# Patient Record
Sex: Male | Born: 1995 | Hispanic: Yes | Marital: Married | Smoking: Current every day smoker
Health system: Southern US, Community
[De-identification: ages and names within clinical notes are randomized; demographics above are authoritative.]

## PROBLEM LIST (undated history)

## (undated) DIAGNOSIS — J45909 Unspecified asthma, uncomplicated: Secondary | ICD-10-CM

## (undated) HISTORY — PX: WISDOM TOOTH EXTRACTION: SHX21

---

## 2018-10-05 ENCOUNTER — Emergency Department (HOSPITAL_COMMUNITY): Payer: Self-pay

## 2018-10-05 ENCOUNTER — Other Ambulatory Visit: Payer: Self-pay

## 2018-10-05 ENCOUNTER — Encounter (HOSPITAL_COMMUNITY): Payer: Self-pay

## 2018-10-05 ENCOUNTER — Emergency Department (HOSPITAL_COMMUNITY)
Admission: EM | Admit: 2018-10-05 | Discharge: 2018-10-06 | Disposition: A | Discharge status: Home / Self Care | Payer: Self-pay | Attending: Emergency Medicine | Admitting: Emergency Medicine

## 2018-10-05 DIAGNOSIS — W1789XA Other fall from one level to another, initial encounter: Secondary | ICD-10-CM | POA: Insufficient documentation

## 2018-10-05 DIAGNOSIS — R55 Syncope and collapse: Secondary | ICD-10-CM | POA: Insufficient documentation

## 2018-10-05 DIAGNOSIS — M25512 Pain in left shoulder: Secondary | ICD-10-CM | POA: Insufficient documentation

## 2018-10-05 DIAGNOSIS — Y999 Unspecified external cause status: Secondary | ICD-10-CM | POA: Insufficient documentation

## 2018-10-05 DIAGNOSIS — S42124A Nondisplaced fracture of acromial process, right shoulder, initial encounter for closed fracture: Secondary | ICD-10-CM | POA: Insufficient documentation

## 2018-10-05 DIAGNOSIS — Y9389 Activity, other specified: Secondary | ICD-10-CM | POA: Insufficient documentation

## 2018-10-05 DIAGNOSIS — F17210 Nicotine dependence, cigarettes, uncomplicated: Secondary | ICD-10-CM | POA: Insufficient documentation

## 2018-10-05 DIAGNOSIS — S0101XA Laceration without foreign body of scalp, initial encounter: Secondary | ICD-10-CM | POA: Insufficient documentation

## 2018-10-05 DIAGNOSIS — W19XXXA Unspecified fall, initial encounter: Secondary | ICD-10-CM

## 2018-10-05 DIAGNOSIS — J45909 Unspecified asthma, uncomplicated: Secondary | ICD-10-CM | POA: Insufficient documentation

## 2018-10-05 DIAGNOSIS — R0789 Other chest pain: Secondary | ICD-10-CM | POA: Insufficient documentation

## 2018-10-05 DIAGNOSIS — Y92810 Car as the place of occurrence of the external cause: Secondary | ICD-10-CM | POA: Insufficient documentation

## 2018-10-05 DIAGNOSIS — M549 Dorsalgia, unspecified: Secondary | ICD-10-CM | POA: Insufficient documentation

## 2018-10-05 DIAGNOSIS — S161XXA Strain of muscle, fascia and tendon at neck level, initial encounter: Secondary | ICD-10-CM | POA: Insufficient documentation

## 2018-10-05 DIAGNOSIS — S0990XA Unspecified injury of head, initial encounter: Secondary | ICD-10-CM

## 2018-10-05 HISTORY — DX: Unspecified asthma, uncomplicated: J45.909

## 2018-10-05 LAB — COMPREHENSIVE METABOLIC PANEL
ALT: 16 U/L (ref 0–44)
AST: 20 U/L (ref 15–41)
Albumin: 4.3 g/dL (ref 3.5–5.0)
Alkaline Phosphatase: 58 U/L (ref 38–126)
Anion gap: 12 (ref 5–15)
BUN: 15 mg/dL (ref 6–20)
CO2: 22 mmol/L (ref 22–32)
Calcium: 9 mg/dL (ref 8.9–10.3)
Chloride: 108 mmol/L (ref 98–111)
Creatinine, Ser: 1.16 mg/dL (ref 0.61–1.24)
GFR calc Af Amer: >60 mL/min (ref >60)
GFR calc non Af Amer: >60 mL/min (ref >60)
Glucose, Bld: 103 mg/dL — ABNORMAL HIGH (ref 70–99)
Potassium: 3.6 mmol/L (ref 3.5–5.1)
Sodium: 142 mmol/L (ref 135–145)
Total Bilirubin: 0.5 mg/dL (ref 0.3–1.2)
Total Protein: 7.3 g/dL (ref 6.5–8.1)

## 2018-10-05 LAB — CBC
HCT: 40.6 % (ref 39.0–52.0)
Hemoglobin: 13.2 g/dL (ref 13.0–17.0)
MCH: 25.1 pg — ABNORMAL LOW (ref 26.0–34.0)
MCHC: 32.5 g/dL (ref 30.0–36.0)
MCV: 77.3 fL — ABNORMAL LOW (ref 80.0–100.0)
Platelets: 281 10*3/uL (ref 150–400)
RBC: 5.25 MIL/uL (ref 4.22–5.81)
RDW: 13.2 % (ref 11.5–15.5)
WBC: 9.7 10*3/uL (ref 4.0–10.5)
nRBC: 0 % (ref 0.0–0.2)

## 2018-10-05 LAB — CDS SEROLOGY

## 2018-10-05 LAB — PROTIME-INR
INR: 1.1 (ref 0.8–1.2)
Prothrombin Time: 13.9 seconds (ref 11.4–15.2)

## 2018-10-05 LAB — LACTIC ACID, PLASMA: Lactic Acid, Venous: 3.2 mmol/L (ref 0.5–1.9)

## 2018-10-05 LAB — ETHANOL: Alcohol, Ethyl (B): <10 mg/dL (ref <10)

## 2018-10-05 MED ORDER — MORPHINE SULFATE (PF) 4 MG/ML IV SOLN
INTRAVENOUS | Status: AC
  Filled 2018-10-05: qty 1

## 2018-10-05 MED ORDER — MORPHINE SULFATE (PF) 4 MG/ML IV SOLN
INTRAVENOUS | Status: AC
  Administered 2018-10-05: 4 mg
  Filled 2018-10-05: qty 1

## 2018-10-05 MED ORDER — IOHEXOL 350 MG/ML SOLN
100.0000 mL | Freq: Once | INTRAVENOUS | Status: AC | PRN
  Administered 2018-10-05: 100 mL via INTRAVENOUS

## 2018-10-05 MED ORDER — MORPHINE SULFATE (PF) 4 MG/ML IV SOLN
4.0000 mg | Freq: Once | INTRAVENOUS | Status: AC
  Administered 2018-10-05: 4 mg via INTRAVENOUS

## 2018-10-05 MED ORDER — MORPHINE SULFATE (PF) 4 MG/ML IV SOLN
4.0000 mg | Freq: Once | INTRAVENOUS | Status: AC
  Administered 2018-10-05: 4 mg via INTRAVENOUS
  Filled 2018-10-05: qty 1

## 2018-10-05 NOTE — ED Notes (Signed)
Pt has been visited by friends and or family

## 2018-10-05 NOTE — ED Notes (Signed)
Lactic Acid of 3.2 called to this RN by lab.
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
## 2018-10-05 NOTE — ED Notes (Signed)
Pain med given 

## 2018-10-05 NOTE — ED Notes (Signed)
Patient transported to CT 
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
## 2018-10-05 NOTE — ED Notes (Signed)
Wife at  The bedside 

## 2018-10-05 NOTE — ED Notes (Signed)
The pt is c/o pain he just returned from [place]  Most of his pain is in both his shoulders and the back of his head  He wants the c-cokkar off  Not yet

## 2018-10-06 LAB — SAMPLE TO BLOOD BANK

## 2018-10-06 MED ORDER — HYDROCODONE-ACETAMINOPHEN 5-325 MG PO TABS: 1.0000 | ORAL_TABLET | Freq: Four times a day (QID) | ORAL | 0 refills | Status: AC | PRN

## 2018-10-06 MED ORDER — HYDROCODONE-ACETAMINOPHEN 5-325 MG PO TABS: 1.0000 | ORAL_TABLET | Freq: Four times a day (QID) | ORAL | 0 refills | Status: DC | PRN

## 2018-10-06 NOTE — ED Notes (Signed)
Ortho tech to place an arm sling on the pts rt arm

## 2018-10-06 NOTE — Discharge Instructions (Addendum)
Local wound care with bacitracin and dressing changes twice daily.  Wear arm sling until followed up with orthopedics.  The contact information for Dr. Marcelino Scot has been provided in this discharge summary for you to call and make these arrangements.  This appointment should take place in the next week.  Ice for 20 minutes every 2 hours while awake for the next 2 days.  Take hydrocodone as prescribed as needed for pain.

## 2018-10-06 NOTE — ED Notes (Signed)
Pt had portable ankle xray  Not talking very much even to his wife

## 2018-10-08 LAB — I-STAT CHEM 8, ED
BUN: 21 mg/dL — ABNORMAL HIGH (ref 6–20)
Calcium, Ion: 0.92 mmol/L — ABNORMAL LOW (ref 1.15–1.40)
Chloride: 108 mmol/L (ref 98–111)
Creatinine, Ser: 1.1 mg/dL (ref 0.61–1.24)
Glucose, Bld: 98 mg/dL (ref 70–99)
HCT: 39 % (ref 39.0–52.0)
Hemoglobin: 13.3 g/dL (ref 13.0–17.0)
Potassium: 6.9 mmol/L (ref 3.5–5.1)
Sodium: 140 mmol/L (ref 135–145)
TCO2: 24 mmol/L (ref 22–32)
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
## 2023-10-31 ENCOUNTER — Ambulatory Visit (HOSPITAL_COMMUNITY): Payer: Self-pay
